# Patient Record
Sex: Male | Born: 1995 | Race: White | Hispanic: No | Marital: Single | Smoking: Current some day smoker
Health system: Southern US, Community
[De-identification: ages and names within clinical notes are randomized; demographics above are authoritative.]

---

## 2015-05-04 ENCOUNTER — Encounter: Payer: Self-pay | Admitting: Emergency Medicine

## 2015-05-04 ENCOUNTER — Emergency Department: Payer: No Typology Code available for payment source

## 2015-05-04 ENCOUNTER — Emergency Department
Admission: EM | Admit: 2015-05-04 | Discharge: 2015-05-04 | Disposition: A | Discharge status: Home / Self Care | Payer: No Typology Code available for payment source | Attending: Emergency Medicine | Admitting: Emergency Medicine

## 2015-05-04 DIAGNOSIS — Y998 Other external cause status: Secondary | ICD-10-CM | POA: Diagnosis not present

## 2015-05-04 DIAGNOSIS — R52 Pain, unspecified: Secondary | ICD-10-CM

## 2015-05-04 DIAGNOSIS — Y9389 Activity, other specified: Secondary | ICD-10-CM | POA: Diagnosis not present

## 2015-05-04 DIAGNOSIS — S022XXA Fracture of nasal bones, initial encounter for closed fracture: Secondary | ICD-10-CM | POA: Insufficient documentation

## 2015-05-04 DIAGNOSIS — F17200 Nicotine dependence, unspecified, uncomplicated: Secondary | ICD-10-CM | POA: Insufficient documentation

## 2015-05-04 DIAGNOSIS — S060X1A Concussion with loss of consciousness of 30 minutes or less, initial encounter: Secondary | ICD-10-CM | POA: Insufficient documentation

## 2015-05-04 DIAGNOSIS — Y92410 Unspecified street and highway as the place of occurrence of the external cause: Secondary | ICD-10-CM | POA: Insufficient documentation

## 2015-05-04 DIAGNOSIS — S0993XA Unspecified injury of face, initial encounter: Secondary | ICD-10-CM | POA: Diagnosis present

## 2015-05-04 MED ORDER — OXYCODONE-ACETAMINOPHEN 5-325 MG PO TABS: 1.0000 | ORAL_TABLET | Freq: Four times a day (QID) | ORAL | Status: AC | PRN

## 2015-05-04 MED ORDER — NAPROXEN 500 MG PO TABS: 500.0000 mg | ORAL_TABLET | Freq: Two times a day (BID) | ORAL | Status: AC

## 2015-05-04 MED ORDER — OXYCODONE-ACETAMINOPHEN 5-325 MG PO TABS
1.0000 | ORAL_TABLET | Freq: Once | ORAL | Status: AC
  Administered 2015-05-04: 1 via ORAL
  Filled 2015-05-04: qty 1
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
## 2015-05-04 NOTE — Discharge Instructions (Signed)
You were prescribed a medication that is potentially sedating. Do not drink alcohol, drive or participate in any other potentially dangerous activities while taking this medication as it may make you sleepy. Do not take this medication with any other sedating medications, either prescription or over-the-counter. If you were prescribed Percocet or Vicodin, do not take these with acetaminophen (Tylenol) as it is already contained within these medications.   Opioid pain medications (or "narcotics") can be habit forming.  Use it as little as possible to achieve adequate pain control.  Do not use or use it with extreme caution if you have a history of opiate abuse or dependence.  If you are on a pain contract with your primary care doctor or a pain specialist, be sure to let them know you were prescribed this medication today from the [facility] Emergency Department.  This medication is intended for your use only - do not give any to anyone else and keep it in a secure place where nobody else, especially children and pets, have access to it.  It will also cause or worsen constipation, so you may want to consider taking an over-the-counter stool softener while you are taking this medication.  Concussion, Adult A concussion, or closed-head injury, is a brain injury caused by a direct blow to the head or by a quick and sudden movement (jolt) of the head or neck. Concussions are usually not life-threatening. Even so, the effects of a concussion can be serious. If you have had a concussion before, you are more likely to experience concussion-like symptoms after a direct blow to the head.  CAUSES  Direct blow to the head, such as from running into another player during a soccer game, being hit in a fight, or hitting your head on a hard surface.  A jolt of the head or neck that causes the brain to move back and forth inside the skull, such as in a car crash. SIGNS AND SYMPTOMS The signs of a concussion can be  hard to notice. Early on, they may be missed by you, family members, and health care providers. You may look fine but act or feel differently. Symptoms are usually temporary, but they may last for days, weeks, or even longer. Some symptoms may appear right away while others may not show up for hours or days. Every head injury is different. Symptoms include:  Mild to moderate headaches that will not go away.  A feeling of pressure inside your head.  Having more trouble than usual:  Learning or remembering things you have heard.  Answering questions.  Paying attention or concentrating.  Organizing daily tasks.  Making decisions and solving problems.  Slowness in thinking, acting or reacting, speaking, or reading.  Getting lost or being easily confused.  Feeling tired all the time or lacking energy (fatigued).  Feeling drowsy.  Sleep disturbances.  Sleeping more than usual.  Sleeping less than usual.  Trouble falling asleep.  Trouble sleeping (insomnia).  Loss of balance or feeling lightheaded or dizzy.  Nausea or vomiting.  Numbness or tingling.  Increased sensitivity to:  Sounds.  Lights.  Distractions.  Vision problems or eyes that tire easily.  Diminished sense of taste or smell.  Ringing in the ears.  Mood changes such as feeling sad or anxious.  Becoming easily irritated or angry for little or no reason.  Lack of motivation.  Seeing or hearing things other people do not see or hear (hallucinations). DIAGNOSIS Your health care provider can usually  diagnose a concussion based on a description of your injury and symptoms. He or she will ask whether you passed out (lost consciousness) and whether you are having trouble remembering events that happened right before and during your injury. Your evaluation might include:  A brain scan to look for signs of injury to the brain. Even if the test shows no injury, you may still have a concussion.  Blood  tests to be sure other problems are not present. TREATMENT  Concussions are usually treated in an emergency department, in urgent care, or at a clinic. You may need to stay in the hospital overnight for further treatment.  Tell your health care provider if you are taking any medicines, including prescription medicines, over-the-counter medicines, and natural remedies. Some medicines, such as blood thinners (anticoagulants) and aspirin, may increase the chance of complications. Also tell your health care provider whether you have had alcohol or are taking illegal drugs. This information may affect treatment.  Your health care provider will send you home with important instructions to follow.  How fast you will recover from a concussion depends on many factors. These factors include how severe your concussion is, what part of your brain was injured, your age, and how healthy you were before the concussion.  Most people with mild injuries recover fully. Recovery can take time. In general, recovery is slower in older persons. Also, persons who have had a concussion in the past or have other medical problems may find that it takes longer to recover from their current injury. HOME CARE INSTRUCTIONS General Instructions  Carefully follow the directions your health care provider gave you.  Only take over-the-counter or prescription medicines for pain, discomfort, or fever as directed by your health care provider.  Take only those medicines that your health care provider has approved.  Do not drink alcohol until your health care provider says you are well enough to do so. Alcohol and certain other drugs may slow your recovery and can put you at risk of further injury.  If it is harder than usual to remember things, write them down.  If you are easily distracted, try to do one thing at a time. For example, do not try to watch TV while fixing dinner.  Talk with family members or close friends when  making important decisions.  Keep all follow-up appointments. Repeated evaluation of your symptoms is recommended for your recovery.  Watch your symptoms and tell others to do the same. Complications sometimes occur after a concussion. Older adults with a brain injury may have a higher risk of serious complications, such as a blood clot on the brain.  Tell your teachers, school nurse, school counselor, coach, athletic trainer, or work Production designer, theatre/television/film about your injury, symptoms, and restrictions. Tell them about what you can or cannot do. They should watch for:  Increased problems with attention or concentration.  Increased difficulty remembering or learning new information.  Increased time needed to complete tasks or assignments.  Increased irritability or decreased ability to cope with stress.  Increased symptoms.  Rest. Rest helps the brain to heal. Make sure you:  Get plenty of sleep at night. Avoid staying up late at night.  Keep the same bedtime hours on weekends and weekdays.  Rest during the day. Take daytime naps or rest breaks when you feel tired.  Limit activities that require a lot of thought or concentration. These include:  Doing homework or job-related work.  Watching TV.  Working on the computer.  Avoid any situation where there is potential for another head injury (football, hockey, soccer, basketball, martial arts, downhill snow sports and horseback riding). Your condition will get worse every time you experience a concussion. You should avoid these activities until you are evaluated by the appropriate follow-up health care providers. Returning To Your Regular Activities You will need to return to your normal activities slowly, not all at once. You must give your body and brain enough time for recovery.  Do not return to sports or other athletic activities until your health care provider tells you it is safe to do so.  Ask your health care provider when you can  drive, ride a bicycle, or operate heavy machinery. Your ability to react may be slower after a brain injury. Never do these activities if you are dizzy.  Ask your health care provider about when you can return to work or school. Preventing Another Concussion It is very important to avoid another brain injury, especially before you have recovered. In rare cases, another injury can lead to permanent brain damage, brain swelling, or death. The risk of this is greatest during the first 7-10 days after a head injury. Avoid injuries by:  Wearing a seat belt when riding in a car.  Drinking alcohol only in moderation.  Wearing a helmet when biking, skiing, skateboarding, skating, or doing similar activities.  Avoiding activities that could lead to a second concussion, such as contact or recreational sports, until your health care provider says it is okay.  Taking safety measures in your home.  Remove clutter and tripping hazards from floors and stairways.  Use grab bars in bathrooms and handrails by stairs.  Place non-slip mats on floors and in bathtubs.  Improve lighting in dim areas. SEEK MEDICAL CARE IF:  You have increased problems paying attention or concentrating.  You have increased difficulty remembering or learning new information.  You need more time to complete tasks or assignments than before.  You have increased irritability or decreased ability to cope with stress.  You have more symptoms than before. Seek medical care if you have any of the following symptoms for more than 2 weeks after your injury:  Lasting (chronic) headaches.  Dizziness or balance problems.  Nausea.  Vision problems.  Increased sensitivity to noise or light.  Depression or mood swings.  Anxiety or irritability.  Memory problems.  Difficulty concentrating or paying attention.  Sleep problems.  Feeling tired all the time. SEEK IMMEDIATE MEDICAL CARE IF:  You have severe or worsening  headaches. These may be a sign of a blood clot in the brain.  You have weakness (even if only in one hand, leg, or part of the face).  You have numbness.  You have decreased coordination.  You vomit repeatedly.  You have increased sleepiness.  One pupil is larger than the other.  You have convulsions.  You have slurred speech.  You have increased confusion. This may be a sign of a blood clot in the brain.  You have increased restlessness, agitation, or irritability.  You are unable to recognize people or places.  You have neck pain.  It is difficult to wake you up.  You have unusual behavior changes.  You lose consciousness. MAKE SURE YOU:  Understand these instructions.  Will watch your condition.  Will get help right away if you are not doing well or get worse.   This information is not intended to replace advice given to you by your health care provider.  Make sure you discuss any questions you have with your health care provider.   Document Released: 06/27/2003 Document Revised: 04/27/2014 Document Reviewed: 10/27/2012 Elsevier Interactive Patient Education 2016 [employer]  Musculoskeletal Pain Musculoskeletal pain is muscle and boney aches and pains. These pains can occur in any part of the body. Your caregiver may treat you without knowing the cause of the pain. They may treat you if blood or urine tests, X-rays, and other tests were normal.  CAUSES There is often not a definite cause or reason for these pains. These pains may be caused by a type of germ (virus). The discomfort may also come from overuse. Overuse includes working out too hard when your body is not fit. Boney aches also come from weather changes. Bone is sensitive to atmospheric pressure changes. HOME CARE INSTRUCTIONS   Ask when your test results will be ready. Make sure you get your test results.  Only take over-the-counter or prescription medicines for pain, discomfort, or fever as  directed by your caregiver. If you were given medications for your condition, do not drive, operate machinery or power tools, or sign legal documents for 24 hours. Do not drink alcohol. Do not take sleeping pills or other medications that may interfere with treatment.  Continue all activities unless the activities cause more pain. When the pain lessens, slowly resume normal activities. Gradually increase the intensity and duration of the activities or exercise.  During periods of severe pain, bed rest may be helpful. Lay or sit in any position that is comfortable.  Putting ice on the injured area.  Put ice in a bag.  Place a towel between your skin and the bag.  Leave the ice on for 15 to 20 minutes, 3 to 4 times a day.  Follow up with your caregiver for continued problems and no reason can be found for the pain. If the pain becomes worse or does not go away, it may be necessary to repeat tests or do additional testing. Your caregiver may need to look further for a possible cause. SEEK IMMEDIATE MEDICAL CARE IF:  You have pain that is getting worse and is not relieved by medications.  You develop chest pain that is associated with shortness or breath, sweating, feeling sick to your stomach (nauseous), or throw up (vomit).  Your pain becomes localized to the abdomen.  You develop any new symptoms that seem different or that concern you. MAKE SURE YOU:   Understand these instructions.  Will watch your condition.  Will get help right away if you are not doing well or get worse.   This information is not intended to replace advice given to you by your health care provider. Make sure you discuss any questions you have with your health care provider.   Document Released: 04/06/2005 Document Revised: 06/29/2011 Document Reviewed: 12/09/2012 Elsevier Interactive Patient Education 2016 [employer]  Nasal Fracture A nasal fracture is a break or crack in the bones or cartilage of the  nose. Minor breaks do not require treatment. These breaks usually heal on their own after about one month. Serious breaks may require surgery. CAUSES This injury is usually caused by a blunt injury to the nose. This type of injury often occurs from:  Contact sports.  Car accidents.  Falls.  Getting punched. SYMPTOMS Symptoms of this injury include:  Pain.  Swelling of the nose.  Bleeding from the nose.  Bruising around the nose or eyes. This may include having black eyes.  Crooked  appearance of the nose. DIAGNOSIS This injury may be diagnosed with a physical exam. The health care provider will gently feel the nose for signs of broken bones. He or she will look inside the nostrils to make sure that there is not a blood-filled swelling on the dividing wall between the nostrils (septal hematoma). X-rays of the nose may not show a nasal fracture even when one is present. In some cases, X-rays or a CT scan may be done 1-5 days after the injury. Sometimes, the health care provider will want to wait until the swelling has gone down. TREATMENT Often, minor fractures that have caused no deformity do not require treatment. More serious fractures in which bones have moved out of position may require surgery, which will take place after the swelling is gone. Surgery will stabilize and align the fracture. In some cases, a health care provider may be able to reposition the bones without surgery. This may be done in the health care provider's office after medicine is given to numb the area (local anesthetic). HOME CARE INSTRUCTIONS  If directed, apply ice to the injured area:  Put ice in a plastic bag.  Place a towel between your skin and the bag.  Leave the ice on for 20 minutes, 2-3 times per day.  Take over-the-counter and prescription medicines only as told by your health care provider.  If your nose starts to bleed, sit in an upright position while you squeeze the soft parts of your nose  against the dividing wall between your nostrils (septum) for 10 minutes.  Try to avoid blowing your nose.  Return to your normal activities as told by your health care provider. Ask your health care provider what activities are safe for you.  Avoid contact sports for 3-4 weeks or as told by your health care provider.  Keep all follow-up visits as told by your health care provider. This is important. SEEK MEDICAL CARE IF:  Your pain increases or becomes severe.  You continue to have nosebleeds.  The shape of your nose does not return to normal within 5 days.  You have pus draining out of your nose. SEEK IMMEDIATE MEDICAL CARE IF:  You have bleeding from your nose that does not stop after you pinch your nostrils closed for 20 minutes and keep ice on your nose.  You have clear fluid draining out of your nose.  You notice a grape-like swelling on the septum. This swelling is a collection of blood (hematoma) that must be drained to help prevent infection.  You have difficulty moving your eyes.  You have repeated vomiting.   This information is not intended to replace advice given to you by your health care provider. Make sure you discuss any questions you have with your health care provider.   Document Released: 04/03/2000 Document Revised: 12/26/2014 Document Reviewed: 05/14/2014 Elsevier Interactive Patient Education [employer]
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
## 2015-05-04 NOTE — ED Notes (Signed)
pts mom called me in triage and said she did not want pt to have ct or xrays. I told her that to me pts condition was such that he needed ct. She wanted to talk to pt but pt was in ct. I called ct and told them to wait on ct until I could talk to pt. Went to ct, told pt moms concerns, pt stated that he wanted ct. Pt is 20 y.o. CT completed.
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
